# Patient Record
Sex: Male | Born: 1990 | Race: Black or African American | Hispanic: No | Marital: Single
Health system: Western US, Academic
[De-identification: ages and names within clinical notes are randomized; demographics above are authoritative.]

## PROBLEM LIST (undated history)

## (undated) DIAGNOSIS — D550 Anemia due to glucose-6-phosphate dehydrogenase [G6PD] deficiency: Secondary | ICD-10-CM

---
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
## 2009-06-17 ENCOUNTER — Emergency Department: Payer: Self-pay | Admitting: Emergency Medicine
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
## 2015-04-29 ENCOUNTER — Encounter: Payer: Self-pay | Admitting: Emergency Medicine

## 2015-04-29 ENCOUNTER — Emergency Department
Admission: EM | Admit: 2015-04-29 | Discharge: 2015-04-29 | Disposition: A | Discharge status: Home / Self Care | Payer: Self-pay | Attending: Emergency Medicine | Admitting: Emergency Medicine

## 2015-04-29 ENCOUNTER — Emergency Department: Payer: Self-pay

## 2015-04-29 DIAGNOSIS — Y92410 Unspecified street and highway as the place of occurrence of the external cause: Secondary | ICD-10-CM | POA: Insufficient documentation

## 2015-04-29 DIAGNOSIS — S0990XA Unspecified injury of head, initial encounter: Secondary | ICD-10-CM | POA: Insufficient documentation

## 2015-04-29 DIAGNOSIS — S0031XA Abrasion of nose, initial encounter: Secondary | ICD-10-CM | POA: Insufficient documentation

## 2015-04-29 DIAGNOSIS — Y9389 Activity, other specified: Secondary | ICD-10-CM | POA: Insufficient documentation

## 2015-04-29 DIAGNOSIS — Y998 Other external cause status: Secondary | ICD-10-CM | POA: Insufficient documentation

## 2015-04-29 DIAGNOSIS — S0081XA Abrasion of other part of head, initial encounter: Secondary | ICD-10-CM

## 2015-04-29 DIAGNOSIS — S0083XA Contusion of other part of head, initial encounter: Secondary | ICD-10-CM | POA: Insufficient documentation

## 2015-04-29 HISTORY — DX: Anemia due to glucose-6-phosphate dehydrogenase (g6pd) deficiency: D55.0

## 2015-04-29 MED ORDER — ACETAMINOPHEN 325 MG PO TABS
ORAL_TABLET | ORAL | Status: AC
  Filled 2015-04-29: qty 2

## 2015-04-29 MED ORDER — BACITRACIN ZINC 500 UNIT/GM EX OINT
TOPICAL_OINTMENT | CUTANEOUS | Status: AC
  Administered 2015-04-29: 1 via TOPICAL
  Filled 2015-04-29: qty 0.9

## 2015-04-29 MED ORDER — BACITRACIN 500 UNIT/GM EX OINT
1.0000 "application " | TOPICAL_OINTMENT | Freq: Two times a day (BID) | CUTANEOUS | Status: DC
  Administered 2015-04-29: 1 via TOPICAL
  Filled 2015-04-29 (×3): qty 0.9

## 2015-04-29 MED ORDER — IBUPROFEN 800 MG PO TABS: 800.0000 mg | ORAL_TABLET | Freq: Three times a day (TID) | ORAL | Status: AC | PRN

## 2015-04-29 MED ORDER — ACETAMINOPHEN 325 MG PO TABS
650.0000 mg | ORAL_TABLET | Freq: Once | ORAL | Status: AC
  Administered 2015-04-29: 650 mg via ORAL

## 2015-04-29 MED ORDER — BACITRACIN ZINC 500 UNIT/GM EX OINT: TOPICAL_OINTMENT | CUTANEOUS | Status: AC

## 2015-04-29 NOTE — Discharge Instructions (Signed)
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
## 2018-01-19 ENCOUNTER — Emergency Department
Admission: EM | Admit: 2018-01-19 | Discharge: 2018-01-19 | Disposition: A | Discharge status: Home / Self Care | Payer: Self-pay | Attending: Emergency Medicine | Admitting: Emergency Medicine

## 2018-01-19 DIAGNOSIS — F10920 Alcohol use, unspecified with intoxication, uncomplicated: Principal | ICD-10-CM | POA: Insufficient documentation

## 2018-01-19 LAB — GLUCOSE (POCT): Glucose (POCT): 119 mg/dL — ABNORMAL HIGH (ref 70–99)

## 2018-01-19 NOTE — ED Notes (Addendum)
Patient Handoff: Report received from Efren    1. Name: Jennette KettleJoshua Eddinger MR#: [id number]  2. [facility]  H[facility] Service: ED  3. Pertinent medical history including: NA  a. Diagnosis: Etoh  b. Current condition and code status: Stable,full code  c. Anticipated changes in condition, treatment or action needing follow-up: Metabolize, reassess, pending dispo  d. Review of pertinent medications: NA   e. Pain management: NA    -Failed ambulation 0600 this morning. Will re attempt at 0800

## 2018-01-19 NOTE — ED MD Progress Note (Signed)
Workup Review as of Jan 19 733   Others' Documentation   Tue Jan 19, 2018   [id number] Patient failed ambulation trial, ,patient unsteady on feet.   [CK]      Workup Review User Index  [CK] Starr LakeKutz, Craig Jerome     PT ambulating, tolerating po, clinically sober  Plan dc

## 2018-01-19 NOTE — Discharge Instructions (Signed)
Alcohol Intoxication     You have been seen for alcohol intoxication.     Using alcohol as you did today suggests you might have a problem with alcohol abuse.     Alcohol abuse can cause many problems including liver disease, stomach ulcers and pancreatitis.     Drinking enough alcohol can cause you to stop breathing and die.     Fortunately, you did not suffer any life-threatening complications today.     DO NOT DRIVE A VEHICLE UNDER THE INFLUENCE OF ALCOHOL! YOU MIGHT INJURE OR KILL YOURSELF OR SOMEONE ELSE IF YOU DRINK AND DRIVE.     YOU SHOULD SEEK MEDICAL ATTENTION IMMEDIATELY, EITHER HERE OR AT THE NEAREST EMERGENCY DEPARTMENT, IF ANY OF THE FOLLOWING OCCURS:  · You drink enough to lose consciousness or black out.  · You become confused or lethargic or have trouble thinking, even if you haven’t been drinking.
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
## 2018-01-19 NOTE — ED Notes (Addendum)
Pt a&ox4 awake and alert answering all questions appropriately. Pt ambulatory with steady gait. Pt verbalizes understanding of discharge instructions and will follow up with pmd. Pt verbalizes understanding to return to the ED for worsening symptoms. Pt has no further questions at this time.    -AVS paperwork signed by the patient and placed in medical records container at the [facility] nurses station  -Pt has passed ambulation trial with steady gait  -Pt has 2 cell phones with him and is able to call for an uber to pick him up  -Pt has been provided a sweater due to cold weather  -pt has no further questions and verbalizes contract for safety: denies si/hi/ah/vh  -Pt verbalizes plan for self care and will return home  -Discharge disussed with EDMD Dr. Anibal HendersonVillano

## 2018-01-19 NOTE — ED Notes (Signed)
First contact with patient. Pt resting comfortably in left lateral recumbent position. Even rise and fall of chest. NAD. Side rails x2 upright. Will continue to monitor for sobriety.

## 2018-01-19 NOTE — ED Notes (Signed)
Pt ambulated 40 feet unassisted with steady gait. He is a&ox4 awake and alert answering all questions appropriately. He verbalizes feeling better and will take an uber home at this time.
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
## 2018-01-19 NOTE — ED Notes (Signed)
Dr. Rodman Keykutz attempted to walk pt but pt unable to amb c steady gait per ermd

## 2018-01-19 NOTE — ED Notes (Signed)
Pt still resting in gurney in nad

## 2018-01-19 NOTE — ED Notes (Signed)
Pt noted to have only 2 mobile devices and a set of keys on a lanyard.

## 2018-01-31 NOTE — ED Follow-up Note (Signed)
Follow-up type: Callback       Routine ED Patient Call Back    Patient unable to be contacted, no message left

## 2018-07-09 ENCOUNTER — Ambulatory Visit (HOSPITAL_COMMUNITY): Payer: PPO

## 2018-07-16 ENCOUNTER — Ambulatory Visit
Admit: 2018-07-16 | Discharge: 2018-07-16 | Disposition: A | Discharge status: Home / Self Care | Payer: PPO | Source: Ambulatory Visit | Attending: Pulmonary Disease | Admitting: Pulmonary Disease

## 2018-07-16 DIAGNOSIS — R0602 Shortness of breath: Principal | ICD-10-CM

## 2018-07-20 ENCOUNTER — Ambulatory Visit (HOSPITAL_COMMUNITY): Payer: PPO
